# Patient Record
Sex: Female | Born: 1980 | Race: White | Hispanic: No | Marital: Married | Smoking: Former smoker
Health system: Southern US, Community
[De-identification: ages and names within clinical notes are randomized; demographics above are authoritative.]

---
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
## 2010-12-26 HISTORY — PX: TUBAL LIGATION: SHX77

## 2014-05-21 ENCOUNTER — Encounter (HOSPITAL_COMMUNITY): Payer: Self-pay | Admitting: Emergency Medicine

## 2014-05-21 ENCOUNTER — Emergency Department (HOSPITAL_COMMUNITY): Payer: Self-pay

## 2014-05-21 ENCOUNTER — Observation Stay (HOSPITAL_COMMUNITY)
Admission: EM | Admit: 2014-05-21 | Discharge: 2014-05-23 | Disposition: A | Discharge status: Home / Self Care | Payer: Self-pay | Attending: General Surgery | Admitting: General Surgery

## 2014-05-21 DIAGNOSIS — N83209 Unspecified ovarian cyst, unspecified side: Secondary | ICD-10-CM | POA: Insufficient documentation

## 2014-05-21 DIAGNOSIS — N83202 Unspecified ovarian cyst, left side: Secondary | ICD-10-CM

## 2014-05-21 DIAGNOSIS — S2242XA Multiple fractures of ribs, left side, initial encounter for closed fracture: Secondary | ICD-10-CM | POA: Diagnosis present

## 2014-05-21 DIAGNOSIS — S270XXA Traumatic pneumothorax, initial encounter: Secondary | ICD-10-CM | POA: Insufficient documentation

## 2014-05-21 DIAGNOSIS — S060X9A Concussion with loss of consciousness of unspecified duration, initial encounter: Principal | ICD-10-CM | POA: Insufficient documentation

## 2014-05-21 DIAGNOSIS — S2232XA Fracture of one rib, left side, initial encounter for closed fracture: Secondary | ICD-10-CM

## 2014-05-21 DIAGNOSIS — J939 Pneumothorax, unspecified: Secondary | ICD-10-CM

## 2014-05-21 DIAGNOSIS — S27329A Contusion of lung, unspecified, initial encounter: Secondary | ICD-10-CM | POA: Insufficient documentation

## 2014-05-21 DIAGNOSIS — Z87891 Personal history of nicotine dependence: Secondary | ICD-10-CM | POA: Insufficient documentation

## 2014-05-21 DIAGNOSIS — S27322A Contusion of lung, bilateral, initial encounter: Secondary | ICD-10-CM | POA: Diagnosis present

## 2014-05-21 DIAGNOSIS — N390 Urinary tract infection, site not specified: Secondary | ICD-10-CM | POA: Insufficient documentation

## 2014-05-21 DIAGNOSIS — S2249XA Multiple fractures of ribs, unspecified side, initial encounter for closed fracture: Secondary | ICD-10-CM | POA: Insufficient documentation

## 2014-05-21 DIAGNOSIS — S060XAA Concussion with loss of consciousness status unknown, initial encounter: Secondary | ICD-10-CM | POA: Diagnosis present

## 2014-05-21 LAB — URINALYSIS, ROUTINE W REFLEX MICROSCOPIC
Bilirubin Urine: NEGATIVE
Glucose, UA: NEGATIVE mg/dL
Ketones, ur: 15 mg/dL — AB
Nitrite: POSITIVE — AB
PROTEIN: NEGATIVE mg/dL
Specific Gravity, Urine: 1.028 (ref 1.005–1.030)
Urobilinogen, UA: 1 mg/dL (ref 0.0–1.0)
pH: 6.5 (ref 5.0–8.0)

## 2014-05-21 LAB — COMPREHENSIVE METABOLIC PANEL
ALT: 34 U/L (ref 0–35)
AST: 65 U/L — AB (ref 0–37)
Albumin: 4.3 g/dL (ref 3.5–5.2)
Alkaline Phosphatase: 62 U/L (ref 39–117)
BILIRUBIN TOTAL: 0.7 mg/dL (ref 0.3–1.2)
BUN: 11 mg/dL (ref 6–23)
CO2: 28 meq/L (ref 19–32)
Calcium: 9.1 mg/dL (ref 8.4–10.5)
Chloride: 98 mEq/L (ref 96–112)
Creatinine, Ser: 0.82 mg/dL (ref 0.50–1.10)
GFR calc Af Amer: >90 mL/min (ref >90)
Glucose, Bld: 110 mg/dL — ABNORMAL HIGH (ref 70–99)
Potassium: 3.6 mEq/L — ABNORMAL LOW (ref 3.7–5.3)
SODIUM: 138 meq/L (ref 137–147)
Total Protein: 7.4 g/dL (ref 6.0–8.3)

## 2014-05-21 LAB — SAMPLE TO BLOOD BANK

## 2014-05-21 LAB — URINE MICROSCOPIC-ADD ON

## 2014-05-21 LAB — CBC
HCT: 38.5 % (ref 36.0–46.0)
Hemoglobin: 13.2 g/dL (ref 12.0–15.0)
MCH: 32.5 pg (ref 26.0–34.0)
MCHC: 34.3 g/dL (ref 30.0–36.0)
MCV: 94.8 fL (ref 78.0–100.0)
PLATELETS: 223 10*3/uL (ref 150–400)
RBC: 4.06 MIL/uL (ref 3.87–5.11)
RDW: 12.8 % (ref 11.5–15.5)
WBC: 18.2 10*3/uL — ABNORMAL HIGH (ref 4.0–10.5)

## 2014-05-21 LAB — PROTIME-INR
INR: 1.03 (ref 0.00–1.49)
PROTHROMBIN TIME: 13.3 s (ref 11.6–15.2)

## 2014-05-21 LAB — PREGNANCY, URINE: Preg Test, Ur: NEGATIVE

## 2014-05-21 MED ORDER — TETANUS-DIPHTH-ACELL PERTUSSIS 5-2.5-18.5 LF-MCG/0.5 IM SUSP
0.5000 mL | Freq: Once | INTRAMUSCULAR | Status: AC
  Administered 2014-05-21: 0.5 mL via INTRAMUSCULAR
  Filled 2014-05-21: qty 0.5

## 2014-05-21 MED ORDER — IOHEXOL 300 MG/ML  SOLN
100.0000 mL | Freq: Once | INTRAMUSCULAR | Status: AC | PRN
  Administered 2014-05-21: 100 mL via INTRAVENOUS
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
## 2014-05-21 NOTE — ED Notes (Signed)
Pt placed on 2L 02 per order.
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
## 2014-05-21 NOTE — ED Notes (Signed)
PT-INR need to be reordered as there was not enough blood in tube.

## 2014-05-22 ENCOUNTER — Encounter (HOSPITAL_COMMUNITY): Payer: Self-pay | Admitting: Rehabilitation

## 2014-05-22 DIAGNOSIS — S2242XA Multiple fractures of ribs, left side, initial encounter for closed fracture: Secondary | ICD-10-CM | POA: Diagnosis present

## 2014-05-22 DIAGNOSIS — S27329A Contusion of lung, unspecified, initial encounter: Secondary | ICD-10-CM

## 2014-05-22 DIAGNOSIS — S060X9A Concussion with loss of consciousness of unspecified duration, initial encounter: Secondary | ICD-10-CM | POA: Diagnosis present

## 2014-05-22 DIAGNOSIS — S060XAA Concussion with loss of consciousness status unknown, initial encounter: Secondary | ICD-10-CM | POA: Diagnosis present

## 2014-05-22 DIAGNOSIS — N390 Urinary tract infection, site not specified: Secondary | ICD-10-CM | POA: Diagnosis present

## 2014-05-22 DIAGNOSIS — S27322A Contusion of lung, bilateral, initial encounter: Secondary | ICD-10-CM | POA: Diagnosis present

## 2014-05-22 DIAGNOSIS — S270XXA Traumatic pneumothorax, initial encounter: Secondary | ICD-10-CM | POA: Diagnosis present

## 2014-05-22 DIAGNOSIS — N83209 Unspecified ovarian cyst, unspecified side: Secondary | ICD-10-CM | POA: Insufficient documentation

## 2014-05-22 DIAGNOSIS — S2249XA Multiple fractures of ribs, unspecified side, initial encounter for closed fracture: Secondary | ICD-10-CM

## 2014-05-22 LAB — BASIC METABOLIC PANEL
BUN: 11 mg/dL (ref 6–23)
CALCIUM: 8.7 mg/dL (ref 8.4–10.5)
CO2: 28 mEq/L (ref 19–32)
Chloride: 104 mEq/L (ref 96–112)
Creatinine, Ser: 0.69 mg/dL (ref 0.50–1.10)
Glucose, Bld: 92 mg/dL (ref 70–99)
POTASSIUM: 3.9 meq/L (ref 3.7–5.3)
SODIUM: 140 meq/L (ref 137–147)

## 2014-05-22 MED ORDER — ENOXAPARIN SODIUM 40 MG/0.4ML ~~LOC~~ SOLN
40.0000 mg | SUBCUTANEOUS | Status: DC
  Administered 2014-05-23: 40 mg via SUBCUTANEOUS
  Filled 2014-05-22 (×2): qty 0.4

## 2014-05-22 MED ORDER — ONDANSETRON HCL 4 MG/2ML IJ SOLN: 4.0000 mg | Freq: Four times a day (QID) | INTRAMUSCULAR | Status: DC | PRN

## 2014-05-22 MED ORDER — OXYCODONE HCL 5 MG PO TABS: 5.0000 mg | ORAL_TABLET | ORAL | Status: DC | PRN

## 2014-05-22 MED ORDER — POLYETHYLENE GLYCOL 3350 17 G PO PACK
17.0000 g | PACK | Freq: Every day | ORAL | Status: DC
  Administered 2014-05-22 – 2014-05-23 (×2): 17 g via ORAL
  Filled 2014-05-22 (×2): qty 1

## 2014-05-22 MED ORDER — ACETAMINOPHEN 325 MG PO TABS: 650.0000 mg | ORAL_TABLET | ORAL | Status: DC | PRN

## 2014-05-22 MED ORDER — SODIUM CHLORIDE 0.9 % IV SOLN
INTRAVENOUS | Status: DC
  Administered 2014-05-22: 04:00:00 via INTRAVENOUS

## 2014-05-22 MED ORDER — HYDROMORPHONE HCL PF 1 MG/ML IJ SOLN
1.0000 mg | INTRAMUSCULAR | Status: DC | PRN
  Administered 2014-05-22: 1 mg via INTRAVENOUS
  Filled 2014-05-22: qty 1

## 2014-05-22 MED ORDER — KETOROLAC TROMETHAMINE 30 MG/ML IJ SOLN
30.0000 mg | Freq: Once | INTRAMUSCULAR | Status: AC
  Administered 2014-05-22: 30 mg via INTRAVENOUS
  Filled 2014-05-22: qty 1

## 2014-05-22 MED ORDER — ONDANSETRON HCL 4 MG PO TABS: 4.0000 mg | ORAL_TABLET | Freq: Four times a day (QID) | ORAL | Status: DC | PRN

## 2014-05-22 MED ORDER — OXYCODONE HCL 5 MG PO TABS: 10.0000 mg | ORAL_TABLET | ORAL | Status: DC | PRN

## 2014-05-22 MED ORDER — HYDROMORPHONE HCL PF 1 MG/ML IJ SOLN: 0.5000 mg | INTRAMUSCULAR | Status: DC | PRN

## 2014-05-22 MED ORDER — HYDROCODONE-ACETAMINOPHEN 10-325 MG PO TABS
0.5000 | ORAL_TABLET | ORAL | Status: DC | PRN
  Administered 2014-05-22 – 2014-05-23 (×8): 2 via ORAL
  Filled 2014-05-22 (×8): qty 2

## 2014-05-22 MED ORDER — NAPROXEN 500 MG PO TABS
500.0000 mg | ORAL_TABLET | Freq: Two times a day (BID) | ORAL | Status: DC
Start: 2014-05-22 — End: 2014-05-24
  Administered 2014-05-22 – 2014-05-23 (×4): 500 mg via ORAL
  Filled 2014-05-22 (×5): qty 1

## 2014-05-22 MED ORDER — KETOROLAC TROMETHAMINE 30 MG/ML IJ SOLN: 15.0000 mg | Freq: Three times a day (TID) | INTRAMUSCULAR | Status: DC | PRN

## 2014-05-22 MED ORDER — SULFAMETHOXAZOLE-TMP DS 800-160 MG PO TABS
1.0000 | ORAL_TABLET | Freq: Two times a day (BID) | ORAL | Status: DC
  Administered 2014-05-22 – 2014-05-23 (×3): 1 via ORAL
  Filled 2014-05-22 (×5): qty 1

## 2014-05-22 MED ORDER — BISACODYL 10 MG RE SUPP: 10.0000 mg | Freq: Every day | RECTAL | Status: DC | PRN

## 2014-05-22 MED ORDER — DOCUSATE SODIUM 100 MG PO CAPS
100.0000 mg | ORAL_CAPSULE | Freq: Two times a day (BID) | ORAL | Status: DC
  Administered 2014-05-22 – 2014-05-23 (×3): 100 mg via ORAL
  Filled 2014-05-22 (×4): qty 1
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
## 2014-05-22 NOTE — ED Notes (Signed)
PA at BS.  

## 2014-05-22 NOTE — ED Notes (Signed)
Attempted to give report 

## 2014-05-22 NOTE — Progress Notes (Signed)
UR completed 
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
## 2014-05-23 ENCOUNTER — Observation Stay (HOSPITAL_COMMUNITY): Payer: Self-pay

## 2014-05-23 MED ORDER — HYDROCODONE-ACETAMINOPHEN 10-325 MG PO TABS: 0.5000 | ORAL_TABLET | ORAL | Status: DC | PRN

## 2014-05-23 MED ORDER — NAPROXEN 500 MG PO TABS: 500.0000 mg | ORAL_TABLET | Freq: Two times a day (BID) | ORAL | Status: DC

## 2014-05-23 MED ORDER — SULFAMETHOXAZOLE-TMP DS 800-160 MG PO TABS: 1.0000 | ORAL_TABLET | Freq: Two times a day (BID) | ORAL | Status: AC

## 2014-05-23 MED ORDER — DIPHENHYDRAMINE HCL 12.5 MG/5ML PO ELIX
25.0000 mg | ORAL_SOLUTION | Freq: Four times a day (QID) | ORAL | Status: DC | PRN
  Administered 2014-05-23: 25 mg via ORAL
  Filled 2014-05-23 (×2): qty 10
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
## 2014-05-23 NOTE — Progress Notes (Signed)
MS better. Can likely D/C this PM after speech eval. She may shower. Patient examined and I agree with the assessment and plan  Violeta Gelinas, MD, MPH, FACS Trauma: [phone] General Surgery: [phone]  05/23/2014 10:13 AM

## 2014-05-23 NOTE — Discharge Instructions (Signed)
No driving while taking hydrocodone. °
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
## 2014-05-23 NOTE — Discharge Summary (Signed)
Nayali Talerico, MD, MPH, FACS Trauma: [phone] General Surgery: [phone]  

## 2014-05-23 NOTE — Progress Notes (Signed)
PT Cancellation Note  Patient Details Name: Virginia Holland MRN: [id number] DOB: 1981-09-24   Cancelled Treatment:    Reason Eval/Treat Not Completed: PT screened, no needs identified, will sign off.  Patient ambulating in room with no obvious balance deficits.  Did issue patient education on Concussion and briefly reviewed.     [name] Amato Sevillano 05/23/2014, 10:24 AM

## 2014-05-24 NOTE — ED Provider Notes (Signed)
Medical screening examination/treatment/procedure(s) were performed by non-physician practitioner and as supervising physician I was immediately available for consultation/collaboration.   EKG Interpretation None        Orlandria Kissner N Eliane Hammersmith, DO 05/24/14 0714 

## 2014-05-29 ENCOUNTER — Telehealth (HOSPITAL_COMMUNITY): Payer: Self-pay

## 2014-05-30 MED ORDER — OXYCODONE-ACETAMINOPHEN 10-325 MG PO TABS: 1.0000 | ORAL_TABLET | ORAL | Status: AC | PRN

## 2014-05-30 NOTE — Telephone Encounter (Signed)
Stephani called in to say the Norco was not working well and requested something else. Rx'd Perc 10/325; 1-2 po q4h prn pain, #60, NR and scheduled her an appt to come to clinic next week for a re-evaluation.

## 2014-06-04 ENCOUNTER — Encounter (INDEPENDENT_AMBULATORY_CARE_PROVIDER_SITE_OTHER): Payer: Self-pay

## 2014-06-09 ENCOUNTER — Telehealth (HOSPITAL_COMMUNITY): Payer: Self-pay

## 2014-06-10 NOTE — Telephone Encounter (Signed)
Called, no answer, unable to leave message

## 2014-06-11 ENCOUNTER — Encounter (INDEPENDENT_AMBULATORY_CARE_PROVIDER_SITE_OTHER): Payer: Self-pay

## 2014-06-11 ENCOUNTER — Other Ambulatory Visit (INDEPENDENT_AMBULATORY_CARE_PROVIDER_SITE_OTHER): Payer: Self-pay | Admitting: General Surgery

## 2014-06-11 MED ORDER — HYDROCODONE-ACETAMINOPHEN 10-325 MG PO TABS: 0.5100 | ORAL_TABLET | Freq: Four times a day (QID) | ORAL | Status: DC | PRN

## 2014-06-11 MED ORDER — METHOCARBAMOL 500 MG PO TABS: 500.0000 mg | ORAL_TABLET | Freq: Four times a day (QID) | ORAL | Status: DC
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
## 2014-06-18 ENCOUNTER — Encounter (INDEPENDENT_AMBULATORY_CARE_PROVIDER_SITE_OTHER): Payer: Self-pay

## 2014-06-25 ENCOUNTER — Encounter (INDEPENDENT_AMBULATORY_CARE_PROVIDER_SITE_OTHER): Payer: Self-pay

## 2014-07-03 ENCOUNTER — Encounter (INDEPENDENT_AMBULATORY_CARE_PROVIDER_SITE_OTHER): Payer: Self-pay | Admitting: Orthopedic Surgery
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
## 2015-06-17 IMAGING — CR DG CHEST 2V
2 series · 2 of 2 positions shown · non-contrast
Comparison: 05/21/2014

CLINICAL DATA: Chest discomfort, possible pneumothorax

EXAM:
CHEST  2 VIEW

[w chest pa]
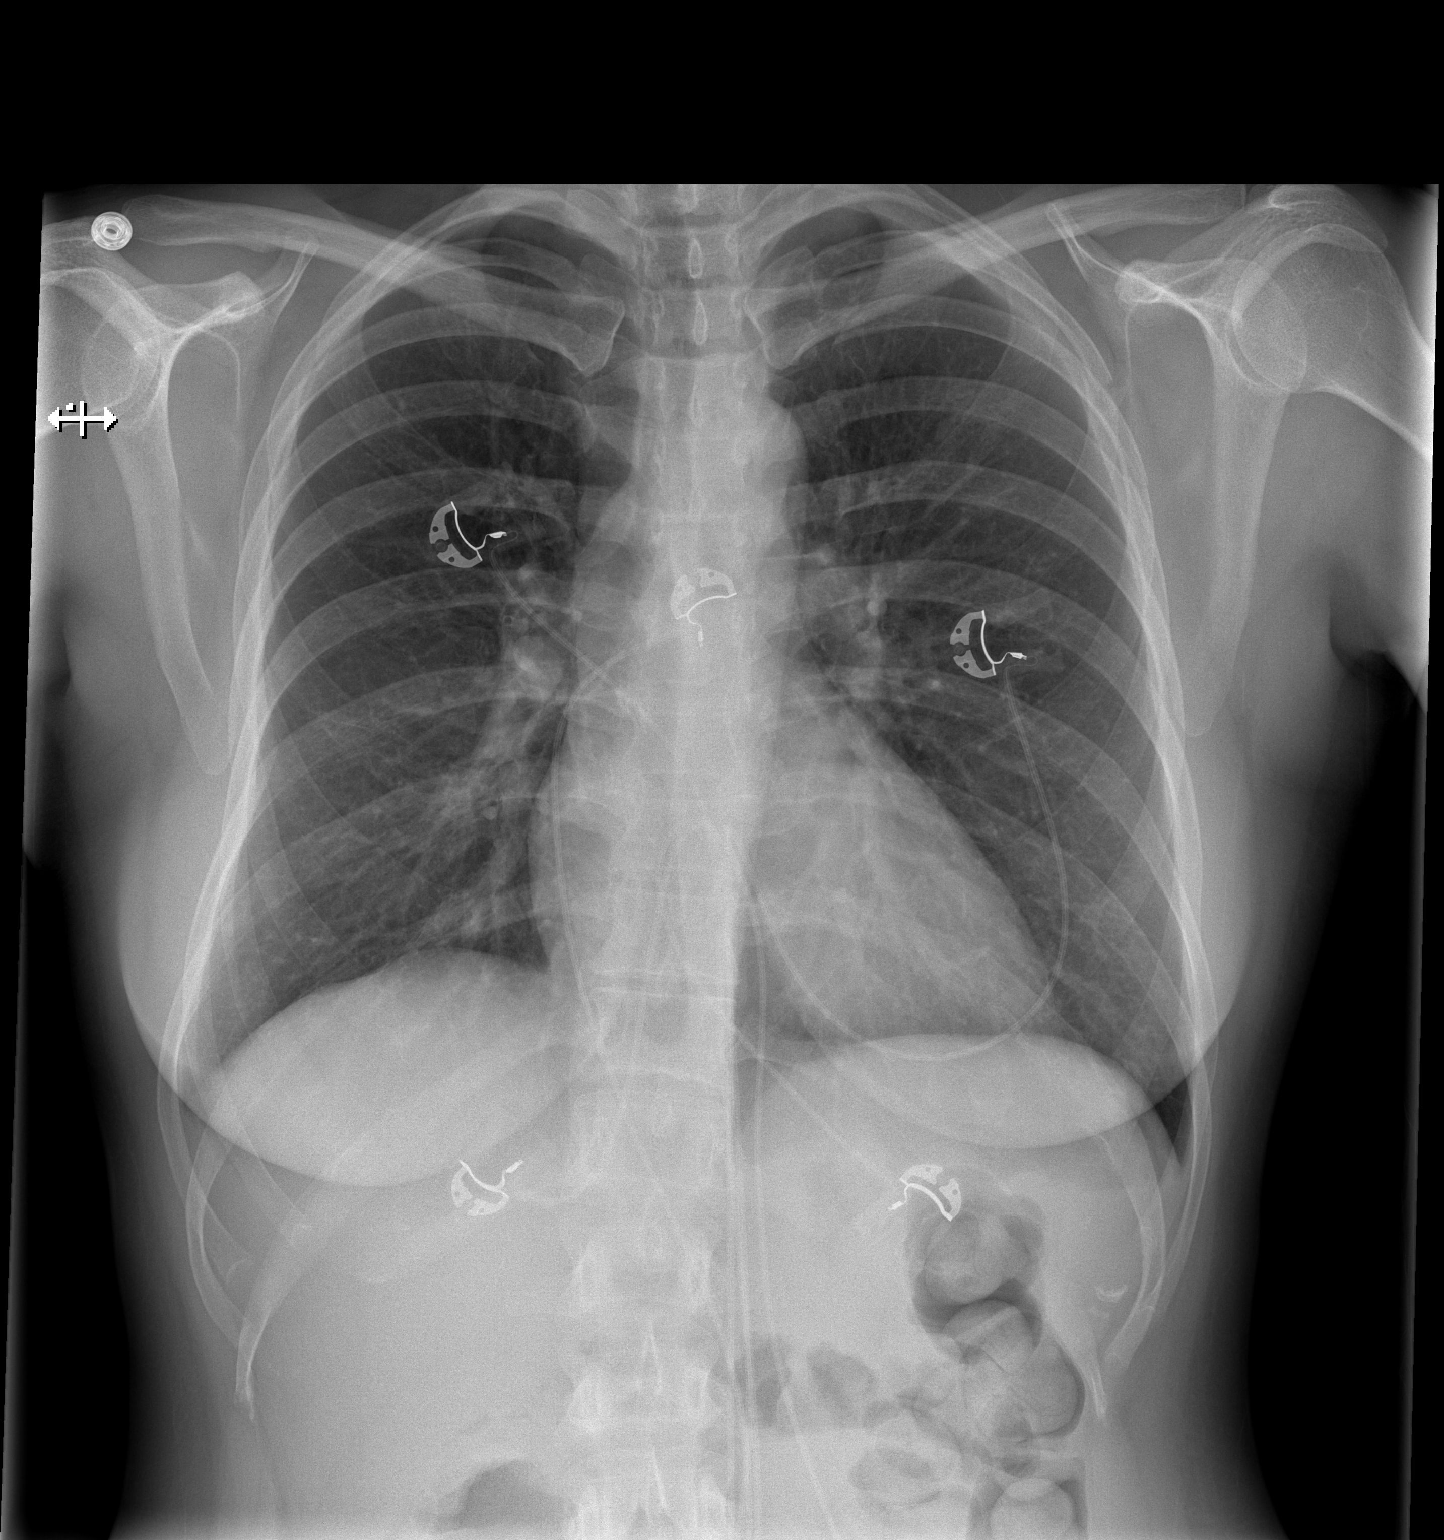

[w chest lat]
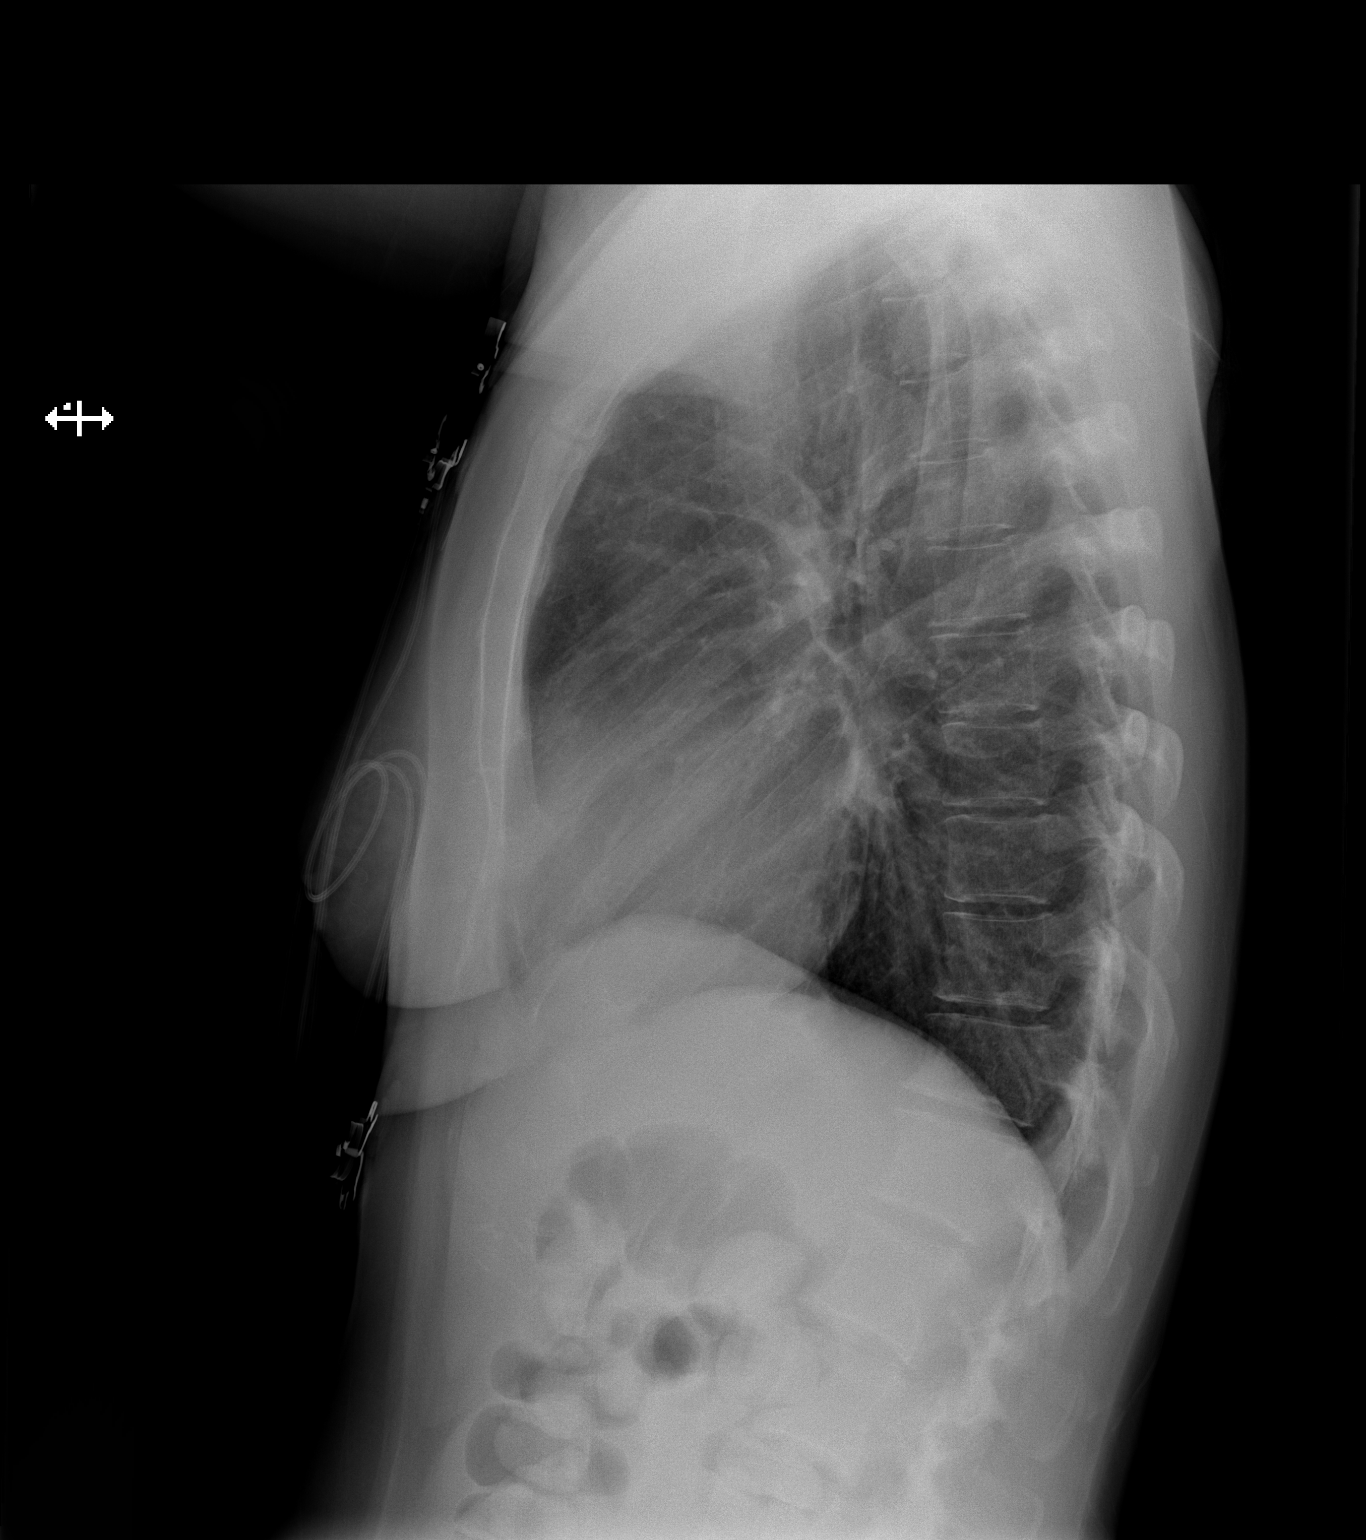

[2 of 2 positions shown; findings below may reference images not displayed]

FINDINGS: Cardiomediastinal silhouette is stable. There is mild displaced
fracture of the left third rib. Nondisplaced fracture of the left
fourth rib. No acute infiltrate or pulmonary edema. Mild thoracic
levoscoliosis. No pneumothorax.
IMPRESSION: No pneumothorax. Left upper rib fractures. No acute infiltrate or
pulmonary edema. Mild upper thoracic levoscoliosis. .

## 2020-07-05 ENCOUNTER — Other Ambulatory Visit: Payer: Self-pay

## 2020-07-05 ENCOUNTER — Emergency Department (HOSPITAL_BASED_OUTPATIENT_CLINIC_OR_DEPARTMENT_OTHER)
Admission: EM | Admit: 2020-07-05 | Discharge: 2020-07-06 | Disposition: A | Discharge status: Home / Self Care | Payer: Self-pay | Attending: Emergency Medicine | Admitting: Emergency Medicine

## 2020-07-05 ENCOUNTER — Encounter (HOSPITAL_BASED_OUTPATIENT_CLINIC_OR_DEPARTMENT_OTHER): Payer: Self-pay | Admitting: Emergency Medicine

## 2020-07-05 DIAGNOSIS — Z87891 Personal history of nicotine dependence: Secondary | ICD-10-CM | POA: Insufficient documentation

## 2020-07-05 DIAGNOSIS — L03012 Cellulitis of left finger: Secondary | ICD-10-CM | POA: Insufficient documentation

## 2020-07-05 NOTE — ED Triage Notes (Signed)
Right thumb pain and swelling. Pt denies injury.

## 2020-07-06 ENCOUNTER — Encounter (HOSPITAL_BASED_OUTPATIENT_CLINIC_OR_DEPARTMENT_OTHER): Payer: Self-pay

## 2020-07-06 ENCOUNTER — Other Ambulatory Visit: Payer: Self-pay

## 2020-07-06 ENCOUNTER — Emergency Department (HOSPITAL_BASED_OUTPATIENT_CLINIC_OR_DEPARTMENT_OTHER)
Admission: EM | Admit: 2020-07-06 | Discharge: 2020-07-06 | Disposition: A | Discharge status: Home / Self Care | Payer: BC Managed Care – PPO | Source: Home / Self Care | Attending: Emergency Medicine | Admitting: Emergency Medicine

## 2020-07-06 DIAGNOSIS — L03012 Cellulitis of left finger: Secondary | ICD-10-CM

## 2020-07-06 DIAGNOSIS — Z87891 Personal history of nicotine dependence: Secondary | ICD-10-CM | POA: Insufficient documentation

## 2020-07-06 MED ORDER — LIDOCAINE HCL 2 % IJ SOLN
10.0000 mL | Freq: Once | INTRAMUSCULAR | Status: AC
  Administered 2020-07-06: 200 mg
  Filled 2020-07-06: qty 20

## 2020-07-06 MED ORDER — OXYCODONE-ACETAMINOPHEN 5-325 MG PO TABS: 1.0000 | ORAL_TABLET | Freq: Once | ORAL | Status: AC

## 2020-07-06 MED ORDER — HYDROCODONE-ACETAMINOPHEN 10-325 MG PO TABS: 0.5000 | ORAL_TABLET | Freq: Four times a day (QID) | ORAL | 0 refills | Status: DC | PRN

## 2020-07-06 MED ORDER — LIDOCAINE HCL 2 % IJ SOLN
20.0000 mL | Freq: Once | INTRAMUSCULAR | Status: AC
  Administered 2020-07-06: 400 mg
  Filled 2020-07-06: qty 20

## 2020-07-06 MED ORDER — OXYCODONE-ACETAMINOPHEN 5-325 MG PO TABS
ORAL_TABLET | ORAL | Status: AC
  Administered 2020-07-06: 1 via ORAL
  Filled 2020-07-06: qty 1

## 2020-07-06 MED ORDER — OXYCODONE-ACETAMINOPHEN 5-325 MG PO TABS
2.0000 | ORAL_TABLET | Freq: Once | ORAL | Status: AC
  Administered 2020-07-06: 2 via ORAL
  Filled 2020-07-06: qty 2

## 2020-07-06 NOTE — Discharge Instructions (Addendum)
Please continue take Keflex as prescribed.  Take Vicodin as needed for severe pain.  Please call Dr. Bari Edward office tomorrow to get in tomorrow or in 2 days.  Please leave the dressing in place until you see hand surgeon.   If the dressing gets soaks, you can remove the gauze but leave the packing inside.   If you can't reach the surgeon, return in 2 days to the ED for packing removal   Return to ER if you have fever, severe pain, worse finger swelling

## 2020-07-06 NOTE — Discharge Instructions (Addendum)
Was seen today and had a paronychia drained.  Soak 2-3 times daily to continue to express drainage.  Continue antibiotics as previously prescribed.  Monitor for signs and symptoms of infection.

## 2020-07-06 NOTE — ED Triage Notes (Signed)
Pt here last night for r thumb paronychia, pain/swelling, drained last night and back today stating increased pain & swelling since. Soaked as education without relief. States bandaged place last night was dry and finger did not drain. Swelling & tender to touch in triage.
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
## 2020-07-06 NOTE — ED Triage Notes (Signed)
LEFT THUMB
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
## 2020-07-06 NOTE — ED Notes (Signed)
Dressing applied to thumb by MD.

## 2020-07-06 NOTE — ED Notes (Signed)
Dr. Horton, ED Provider at bedside. 

## 2020-07-08 ENCOUNTER — Other Ambulatory Visit: Payer: Self-pay

## 2020-07-08 ENCOUNTER — Ambulatory Visit (HOSPITAL_COMMUNITY): Payer: BC Managed Care – PPO | Admitting: Anesthesiology

## 2020-07-08 ENCOUNTER — Encounter (HOSPITAL_COMMUNITY): Payer: Self-pay | Admitting: Orthopedic Surgery

## 2020-07-08 ENCOUNTER — Encounter (HOSPITAL_COMMUNITY)
Admission: RE | Disposition: A | Discharge status: Home / Self Care | Payer: Self-pay | Source: Home / Self Care | Attending: Orthopedic Surgery

## 2020-07-08 ENCOUNTER — Ambulatory Visit (HOSPITAL_COMMUNITY)
Admission: RE | Admit: 2020-07-08 | Discharge: 2020-07-08 | Disposition: A | Discharge status: Home / Self Care | Payer: BC Managed Care – PPO | Attending: Orthopedic Surgery | Admitting: Orthopedic Surgery

## 2020-07-08 DIAGNOSIS — L02512 Cutaneous abscess of left hand: Secondary | ICD-10-CM | POA: Insufficient documentation

## 2020-07-08 DIAGNOSIS — Z87891 Personal history of nicotine dependence: Secondary | ICD-10-CM | POA: Insufficient documentation

## 2020-07-08 DIAGNOSIS — L03012 Cellulitis of left finger: Secondary | ICD-10-CM

## 2020-07-08 DIAGNOSIS — Z20822 Contact with and (suspected) exposure to covid-19: Secondary | ICD-10-CM | POA: Insufficient documentation

## 2020-07-08 HISTORY — PX: INCISION AND DRAINAGE: SHX5863

## 2020-07-08 LAB — POCT PREGNANCY, URINE: Preg Test, Ur: NEGATIVE

## 2020-07-08 LAB — SARS CORONAVIRUS 2 BY RT PCR (HOSPITAL ORDER, PERFORMED IN ~~LOC~~ HOSPITAL LAB): SARS Coronavirus 2: NEGATIVE

## 2020-07-08 SURGERY — INCISION AND DRAINAGE
Anesthesia: General | Laterality: Left

## 2020-07-08 MED ORDER — HYDROMORPHONE HCL 1 MG/ML IJ SOLN
0.2500 mg | INTRAMUSCULAR | Status: DC | PRN
  Administered 2020-07-08 (×4): 0.5 mg via INTRAVENOUS

## 2020-07-08 MED ORDER — PHENYLEPHRINE 40 MCG/ML (10ML) SYRINGE FOR IV PUSH (FOR BLOOD PRESSURE SUPPORT)
PREFILLED_SYRINGE | INTRAVENOUS | Status: AC
  Filled 2020-07-08: qty 10

## 2020-07-08 MED ORDER — LABETALOL HCL 5 MG/ML IV SOLN
10.0000 mg | INTRAVENOUS | Status: DC | PRN
  Administered 2020-07-08: 10 mg via INTRAVENOUS

## 2020-07-08 MED ORDER — KETOROLAC TROMETHAMINE 15 MG/ML IJ SOLN
30.0000 mg | Freq: Once | INTRAMUSCULAR | Status: AC
  Administered 2020-07-08: 30 mg via INTRAVENOUS

## 2020-07-08 MED ORDER — PROPOFOL 10 MG/ML IV BOLUS
INTRAVENOUS | Status: DC | PRN
  Administered 2020-07-08: 140 mg via INTRAVENOUS

## 2020-07-08 MED ORDER — MIDAZOLAM HCL 2 MG/2ML IJ SOLN
INTRAMUSCULAR | Status: AC
  Filled 2020-07-08: qty 2

## 2020-07-08 MED ORDER — LACTATED RINGERS IV SOLN: INTRAVENOUS | Status: DC

## 2020-07-08 MED ORDER — ONDANSETRON HCL 4 MG/2ML IJ SOLN
INTRAMUSCULAR | Status: DC | PRN
  Administered 2020-07-08: 4 mg via INTRAVENOUS

## 2020-07-08 MED ORDER — SUCCINYLCHOLINE CHLORIDE 200 MG/10ML IV SOSY
PREFILLED_SYRINGE | INTRAVENOUS | Status: AC
  Filled 2020-07-08: qty 10

## 2020-07-08 MED ORDER — MIDAZOLAM HCL 2 MG/2ML IJ SOLN
INTRAMUSCULAR | Status: DC | PRN
  Administered 2020-07-08: 2 mg via INTRAVENOUS

## 2020-07-08 MED ORDER — CEFAZOLIN SODIUM-DEXTROSE 2-4 GM/100ML-% IV SOLN
2.0000 g | INTRAVENOUS | Status: DC
  Filled 2020-07-08: qty 100

## 2020-07-08 MED ORDER — FENTANYL CITRATE (PF) 100 MCG/2ML IJ SOLN
INTRAMUSCULAR | Status: AC
  Filled 2020-07-08: qty 2

## 2020-07-08 MED ORDER — ACETAMINOPHEN 500 MG PO TABS
ORAL_TABLET | ORAL | Status: AC
  Filled 2020-07-08: qty 2

## 2020-07-08 MED ORDER — PROPOFOL 10 MG/ML IV BOLUS
INTRAVENOUS | Status: AC
  Filled 2020-07-08: qty 20

## 2020-07-08 MED ORDER — ARTIFICIAL TEARS OPHTHALMIC OINT
TOPICAL_OINTMENT | OPHTHALMIC | Status: AC
  Filled 2020-07-08: qty 3.5

## 2020-07-08 MED ORDER — FENTANYL CITRATE (PF) 250 MCG/5ML IJ SOLN
INTRAMUSCULAR | Status: AC
  Filled 2020-07-08: qty 5

## 2020-07-08 MED ORDER — ACETAMINOPHEN 500 MG PO TABS: 1000.0000 mg | ORAL_TABLET | Freq: Once | ORAL | Status: DC

## 2020-07-08 MED ORDER — LIDOCAINE 2% (20 MG/ML) 5 ML SYRINGE
INTRAMUSCULAR | Status: AC
  Filled 2020-07-08: qty 5

## 2020-07-08 MED ORDER — FENTANYL CITRATE (PF) 100 MCG/2ML IJ SOLN
25.0000 ug | INTRAMUSCULAR | Status: DC | PRN
  Administered 2020-07-08 (×3): 50 ug via INTRAVENOUS

## 2020-07-08 MED ORDER — FENTANYL CITRATE (PF) 100 MCG/2ML IJ SOLN
INTRAMUSCULAR | Status: DC | PRN
  Administered 2020-07-08: 100 ug via INTRAVENOUS
  Administered 2020-07-08: 50 ug via INTRAVENOUS
  Administered 2020-07-08: 100 ug via INTRAVENOUS

## 2020-07-08 MED ORDER — BUPIVACAINE HCL (PF) 0.25 % IJ SOLN
INTRAMUSCULAR | Status: AC
  Filled 2020-07-08: qty 20

## 2020-07-08 MED ORDER — OXYCODONE HCL 5 MG PO TABS
5.0000 mg | ORAL_TABLET | Freq: Once | ORAL | Status: AC
  Administered 2020-07-08: 5 mg via ORAL

## 2020-07-08 MED ORDER — HYDROMORPHONE HCL 1 MG/ML IJ SOLN
INTRAMUSCULAR | Status: AC
  Filled 2020-07-08: qty 1

## 2020-07-08 MED ORDER — OXYCODONE HCL 5 MG PO TABS
ORAL_TABLET | ORAL | Status: AC
  Filled 2020-07-08: qty 1

## 2020-07-08 MED ORDER — ROCURONIUM BROMIDE 10 MG/ML (PF) SYRINGE
PREFILLED_SYRINGE | INTRAVENOUS | Status: AC
  Filled 2020-07-08: qty 20

## 2020-07-08 MED ORDER — CHLORHEXIDINE GLUCONATE 0.12 % MT SOLN
15.0000 mL | OROMUCOSAL | Status: AC
  Administered 2020-07-08: 15 mL via OROMUCOSAL
  Filled 2020-07-08: qty 15

## 2020-07-08 MED ORDER — LIDOCAINE HCL (CARDIAC) PF 100 MG/5ML IV SOSY
PREFILLED_SYRINGE | INTRAVENOUS | Status: DC | PRN
  Administered 2020-07-08: 60 mg via INTRAVENOUS

## 2020-07-08 MED ORDER — LABETALOL HCL 5 MG/ML IV SOLN
INTRAVENOUS | Status: AC
  Filled 2020-07-08: qty 4

## 2020-07-08 MED ORDER — PROMETHAZINE HCL 25 MG/ML IJ SOLN: 6.2500 mg | INTRAMUSCULAR | Status: DC | PRN

## 2020-07-08 MED ORDER — KETOROLAC TROMETHAMINE 30 MG/ML IJ SOLN
INTRAMUSCULAR | Status: AC
  Administered 2020-07-08: 30 mg
  Filled 2020-07-08: qty 1

## 2020-07-08 MED ORDER — EPHEDRINE 5 MG/ML INJ
INTRAVENOUS | Status: AC
  Filled 2020-07-08: qty 10

## 2020-07-08 MED ORDER — BUPIVACAINE-EPINEPHRINE 0.25% -1:200000 IJ SOLN: INTRAMUSCULAR | Status: DC | PRN

## 2020-07-08 MED ORDER — CEFAZOLIN SODIUM-DEXTROSE 2-3 GM-%(50ML) IV SOLR
INTRAVENOUS | Status: DC | PRN
Start: 2020-07-08 — End: 2020-07-08
  Administered 2020-07-08: 2 g via INTRAVENOUS

## 2020-07-08 SURGICAL SUPPLY — 58 items
BNDG COHESIVE 1X5 TAN STRL LF (GAUZE/BANDAGES/DRESSINGS) ×3 IMPLANT
BNDG CONFORM 2 STRL LF (GAUZE/BANDAGES/DRESSINGS) IMPLANT
BNDG ELASTIC 3X5.8 VLCR STR LF (GAUZE/BANDAGES/DRESSINGS) ×3 IMPLANT
BNDG ELASTIC 4X5.8 VLCR STR LF (GAUZE/BANDAGES/DRESSINGS) ×3 IMPLANT
BNDG ESMARK 4X9 LF (GAUZE/BANDAGES/DRESSINGS) ×3 IMPLANT
BNDG GAUZE ELAST 4 BULKY (GAUZE/BANDAGES/DRESSINGS) ×3 IMPLANT
BNDG STRETCH 4X75 NS LF (GAUZE/BANDAGES/DRESSINGS) ×3 IMPLANT
CORD BIPOLAR FORCEPS 12FT (ELECTRODE) ×3 IMPLANT
COVER SURGICAL LIGHT HANDLE (MISCELLANEOUS) ×3 IMPLANT
COVER WAND RF STERILE (DRAPES) ×3 IMPLANT
CUFF TOURN SGL QUICK 18X4 (TOURNIQUET CUFF) ×3 IMPLANT
CUFF TOURN SGL QUICK 24 (TOURNIQUET CUFF)
CUFF TRNQT CYL 24X4X16.5-23 (TOURNIQUET CUFF) IMPLANT
DRAIN PENROSE 1/4X12 LTX STRL (WOUND CARE) IMPLANT
DRAPE SURG 17X23 STRL (DRAPES) ×3 IMPLANT
DRSG ADAPTIC 3X8 NADH LF (GAUZE/BANDAGES/DRESSINGS) ×3 IMPLANT
DRSG EMULSION OIL 3X3 NADH (GAUZE/BANDAGES/DRESSINGS) ×3 IMPLANT
ELECT REM PT RETURN 9FT ADLT (ELECTROSURGICAL)
ELECTRODE REM PT RTRN 9FT ADLT (ELECTROSURGICAL) IMPLANT
GAUZE PACKING IODOFORM 1/2 (PACKING) ×3 IMPLANT
GAUZE SPONGE 4X4 12PLY STRL (GAUZE/BANDAGES/DRESSINGS) ×3 IMPLANT
GAUZE XEROFORM 1X8 LF (GAUZE/BANDAGES/DRESSINGS) ×3 IMPLANT
GAUZE XEROFORM 5X9 LF (GAUZE/BANDAGES/DRESSINGS) IMPLANT
GLOVE BIOGEL PI IND STRL 8.5 (GLOVE) ×1 IMPLANT
GLOVE BIOGEL PI INDICATOR 8.5 (GLOVE) ×2
GLOVE SURG ORTHO 8.0 STRL STRW (GLOVE) ×3 IMPLANT
GOWN STRL REUS W/ TWL LRG LVL3 (GOWN DISPOSABLE) ×3 IMPLANT
GOWN STRL REUS W/ TWL XL LVL3 (GOWN DISPOSABLE) ×1 IMPLANT
GOWN STRL REUS W/TWL LRG LVL3 (GOWN DISPOSABLE) ×6
GOWN STRL REUS W/TWL XL LVL3 (GOWN DISPOSABLE) ×2
HANDPIECE INTERPULSE COAX TIP (DISPOSABLE)
KIT BASIN OR (CUSTOM PROCEDURE TRAY) ×3 IMPLANT
KIT TURNOVER KIT B (KITS) ×3 IMPLANT
MANIFOLD NEPTUNE II (INSTRUMENTS) ×3 IMPLANT
NEEDLE HYPO 25GX1X1/2 BEV (NEEDLE) IMPLANT
NS IRRIG 1000ML POUR BTL (IV SOLUTION) ×3 IMPLANT
PACK ORTHO EXTREMITY (CUSTOM PROCEDURE TRAY) ×3 IMPLANT
PAD ARMBOARD 7.5X6 YLW CONV (MISCELLANEOUS) ×6 IMPLANT
PAD CAST 4YDX4 CTTN HI CHSV (CAST SUPPLIES) ×1 IMPLANT
PADDING CAST COTTON 4X4 STRL (CAST SUPPLIES) ×2
SET CYSTO W/LG BORE CLAMP LF (SET/KITS/TRAYS/PACK) IMPLANT
SET HNDPC FAN SPRY TIP SCT (DISPOSABLE) IMPLANT
SOAP 2 % CHG 4 OZ (WOUND CARE) ×3 IMPLANT
SPONGE LAP 18X18 RF (DISPOSABLE) ×3 IMPLANT
SPONGE LAP 4X18 RFD (DISPOSABLE) ×3 IMPLANT
SUT ETHILON 4 0 PS 2 18 (SUTURE) IMPLANT
SUT ETHILON 5 0 P 3 18 (SUTURE)
SUT NYLON ETHILON 5-0 P-3 1X18 (SUTURE) IMPLANT
SWAB COLLECTION DEVICE MRSA (MISCELLANEOUS) ×3 IMPLANT
SWAB CULTURE ESWAB REG 1ML (MISCELLANEOUS) IMPLANT
SYR CONTROL 10ML LL (SYRINGE) IMPLANT
TOWEL GREEN STERILE (TOWEL DISPOSABLE) ×3 IMPLANT
TOWEL GREEN STERILE FF (TOWEL DISPOSABLE) ×3 IMPLANT
TUBE CONNECTING 12'X1/4 (SUCTIONS) ×1
TUBE CONNECTING 12X1/4 (SUCTIONS) ×2 IMPLANT
UNDERPAD 30X36 HEAVY ABSORB (UNDERPADS AND DIAPERS) ×3 IMPLANT
WATER STERILE IRR 1000ML POUR (IV SOLUTION) ×3 IMPLANT
YANKAUER SUCT BULB TIP NO VENT (SUCTIONS) ×3 IMPLANT

## 2020-07-08 NOTE — Transfer of Care (Signed)
Immediate Anesthesia Transfer of Care Note  Patient: Virginia Holland  Procedure(s) Performed: INCISION AND DRAINAGE LEFT THUMBL (Left )  Patient Location: PACU  Anesthesia Type:General  Level of Consciousness: awake  Airway & Oxygen Therapy: Patient Spontanous Breathing  Post-op Assessment: Report given to RN and Post -op Vital signs reviewed and stable  Post vital signs: Reviewed and stable  Last Vitals:  Vitals Value Taken Time  BP 135/104 07/08/20 2021  Temp 36.7 C 07/08/20 2020  Pulse 106 07/08/20 2024  Resp 19 07/08/20 2024  SpO2 100 % 07/08/20 2024  Vitals shown include unvalidated device data.  Last Pain:  Vitals:   07/08/20 1730  TempSrc: Oral  PainSc: 10-Worst pain ever      Patients Stated Pain Goal: 0 (07/08/20 1730)  Complications: No complications documented.

## 2020-07-08 NOTE — Discharge Instructions (Signed)
KEEP BANDAGE CLEAN AND DRY °CALL OFFICE FOR F/U APPT [phone] IN 2 DAYS °KEEP HAND ELEVATED ABOVE HEART °OK TO APPLY ICE TO OPERATIVE AREA °CONTACT OFFICE IF ANY WORSENING PAIN OR CONCERNS. °

## 2020-07-08 NOTE — Progress Notes (Signed)
Spoke to MD [facility] about pt's pain in left index finger.  10/10.  No complaints of pain in operative finger.  No relief yet from fentanyl and dilaudid.  Will continue to monitor.
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
## 2020-07-08 NOTE — Progress Notes (Signed)
Spoke to MDA Desmond Lope again regarding pts pain 10/10 with no relief from fentanyl and 2mg  dilaudid.  Pt still A &Ox3.  VSS MDA to order 30 mg ketorolac and 5mg  PO Oxy IR.  Also notified MDA that pts diastolic BP has been over 100 for the length of the PACU stay.  HR 80-90's.  MDA to order 10 labetalol for that.  Will continue to monitor and notify for further changes.

## 2020-07-08 NOTE — Anesthesia Preprocedure Evaluation (Addendum)
Anesthesia Evaluation  Patient identified by MRN, date of birth, ID band Patient awake    Reviewed: Allergy & Precautions, NPO status , Patient's Chart, lab work & pertinent test results  Airway Mallampati: I  TM Distance: >3 FB Neck ROM: Full    Dental  (+) Teeth Intact, Dental Advisory Given   Pulmonary former smoker,    Pulmonary exam normal breath sounds clear to auscultation       Cardiovascular negative cardio ROS Normal cardiovascular exam Rhythm:Regular Rate:Normal     Neuro/Psych PSYCHIATRIC DISORDERS Depression negative neurological ROS     GI/Hepatic negative GI ROS, Neg liver ROS,   Endo/Other  negative endocrine ROS  Renal/GU negative Renal ROS     Musculoskeletal Left thumb infection   Abdominal   Peds  Hematology negative hematology ROS (+)   Anesthesia Other Findings Day of surgery medications reviewed with the patient.  Reproductive/Obstetrics                            Anesthesia Physical Anesthesia Plan  ASA: I  Anesthesia Plan: General   Post-op Pain Management:    Induction: Intravenous  PONV Risk Score and Plan: 3 and Midazolam, Dexamethasone and Ondansetron  Airway Management Planned: LMA  Additional Equipment:   Intra-op Plan:   Post-operative Plan: Extubation in OR  Informed Consent: I have reviewed the patients History and Physical, chart, labs and discussed the procedure including the risks, benefits and alternatives for the proposed anesthesia with the patient or authorized representative who has indicated his/her understanding and acceptance.     Dental advisory given  Plan Discussed with: CRNA  Anesthesia Plan Comments: (Last PO intake 1000.)       Anesthesia Quick Evaluation

## 2020-07-08 NOTE — Op Note (Signed)
PREOPERATIVE DIAGNOSIS: Left thumb complicated abscess  POSTOPERATIVE DIAGNOSIS: Same  ATTENDING SURGEON: Dr. Bradly Bienenstock who scrubbed and present for the entire procedure  ASSISTANT SURGEON: None  ANESTHESIA: General via LMA  OPERATIVE PROCEDURE: #1: Incision and drainage of left thumb complicated abscess #2: Excisional debridement of skin subcutaneous tissue associated with devitalized tissue and infection  IMPLANTS: None  RADIOGRAPHIC INTERPRETATION: None  SURGICAL INDICATIONS: Patient is a right-hand-dominant female who presented to the emergency room on multiple occasions with a worsening infection.  Patient presented to my office today with still persistent draining wound.  Patient is recommended to undergo the above procedure.  Risk benefits alternatives were discussed in detail with the patient and signed informed consent was obtained.  Risks include but not limited to bleeding infection damage nearby nerves arteries or tendons loss of motion of the wrist and digits incomplete relief of symptoms and need for further surgical invention  SURGICAL TECHNIQUE: Patient was palpated via the preoperative holding area marked for marker made on left thumb indicate correct operative site.  Patient brought back operating placed supine on anesthesia table with a general anesthetic was administered.  Preoperative antibiotics were given prior to skin incision.  A well-padded tourniquet placed on the left brachium seal with appropriate drape.  Left upper extremities then prepped and draped normal sterile fashion.  Timeout was called the correct site identified procedure then begun.  The previous incision was then lengthened both proximally distally after the tourniquet insufflated.  Dissection was then carried down to the flexor sheath of the FPL.  Careful protection of the neurovascular bundle was then done in the volar flap.  This was a mid axial incision.  Moderate amount of purulence was removed  from this wound.  Thorough wound irrigation was then done of the complicated abscess area.  Excisional debridement was then done sharply with knife scissors and rondure removing the devitalized and infected tissue.  This was done around the volar portion and dorsal portion of the thumb.  After excisional debridement and drainage of the abscess area the wound was thoroughly irrigated.  Loosely packed with packing gauze iodoform packing gauze and one suture was then placed.  Adaptic dressing sterile compressive bandage then applied.  Tourniquet deflated patient was taken recovery room extubated in good condition.  POSTOPERATIVE PLAN: Patient be discharged to home.  See him back in the office on Friday for packing change sent down to see the therapist for whirlpool and wound care.  She will need aggressive occupational therapy will begin OT at the first postoperative visit and then she will be seen next week Monday Wednesday Friday I will see her back next Friday.  Continue on the oral antibiotics but the wound care is going to be about most importance in compliance with the oral antibiotic regimen as well as the therapy she should get better but this is going to be slow.

## 2020-07-08 NOTE — Anesthesia Procedure Notes (Signed)
Procedure Name: LMA Insertion Date/Time: 07/08/2020 7:49 PM Performed by: Claudina Lick, CRNA Pre-anesthesia Checklist: Patient identified, Emergency Drugs available, Suction available, Patient being monitored and Timeout performed Patient Re-evaluated:Patient Re-evaluated prior to induction Oxygen Delivery Method: Circle system utilized Preoxygenation: Pre-oxygenation with 100% oxygen Induction Type: IV induction Ventilation: Mask ventilation without difficulty LMA: LMA inserted LMA Size: 4.0 Tube type: Oral Number of attempts: 1 Placement Confirmation: positive ETCO2 and breath sounds checked- equal and bilateral Tube secured with: Tape Dental Injury: Teeth and Oropharynx as per pre-operative assessment

## 2020-07-08 NOTE — Progress Notes (Signed)
MDA Desmond Lope called about patients 10/10 pain in left index finger.  Pt has been given 150 fentanyl.  MDA wants to continue with dilaudid.  Will continue to monitor and notify for further changes. This RN has also left a message with the OR charge to have the MD call before he goes in for his next procedure.  Awaiting a call back.

## 2020-07-08 NOTE — Anesthesia Postprocedure Evaluation (Signed)
Anesthesia Post Note  Patient: Hospital doctor [place]  Procedure(s) Performed: INCISION AND DRAINAGE LEFT THUMBL (Left )     Patient location during evaluation: PACU Anesthesia Type: General Level of consciousness: awake and alert, awake and oriented Vital Signs Assessment: post-procedure vital signs reviewed and stable Respiratory status: spontaneous breathing, nonlabored ventilation and respiratory function stable Cardiovascular status: blood pressure returned to baseline and stable Postop Assessment: no apparent nausea or vomiting Anesthetic complications: no   No complications documented.  Last Vitals:  Vitals:   07/08/20 2130 07/08/20 2145  BP: (!) 120/102 (!) 119/91  Pulse: 74 85  Resp: 20 18  Temp:  36.7 C  SpO2: 100% 92%    Last Pain:  Vitals:   07/08/20 2145  TempSrc:   PainSc: 10-Worst pain ever                 Cecile Hearing

## 2020-07-09 ENCOUNTER — Encounter (HOSPITAL_COMMUNITY): Payer: Self-pay | Admitting: Orthopedic Surgery
